# Patient Record
Sex: Male | Born: 1965 | Race: White | Hispanic: No | Marital: Married | Smoking: Never smoker
Health system: Southern US, Community
[De-identification: ages and names within clinical notes are randomized; demographics above are authoritative.]

## PROBLEM LIST (undated history)

## (undated) DIAGNOSIS — M1990 Unspecified osteoarthritis, unspecified site: Secondary | ICD-10-CM

---

## 2009-09-16 ENCOUNTER — Emergency Department (HOSPITAL_BASED_OUTPATIENT_CLINIC_OR_DEPARTMENT_OTHER): Admission: EM | Admit: 2009-09-16 | Discharge: 2009-09-16 | Payer: Self-pay | Admitting: Emergency Medicine

## 2009-09-16 ENCOUNTER — Ambulatory Visit: Payer: Self-pay | Admitting: Diagnostic Radiology

## 2010-05-18 LAB — BASIC METABOLIC PANEL
Calcium: 9.9 mg/dL (ref 8.4–10.5)
Chloride: 106 mEq/L (ref 96–112)
Glucose, Bld: 86 mg/dL (ref 70–99)

## 2010-05-18 LAB — DIFFERENTIAL
Basophils Absolute: 0.2 10*3/uL — ABNORMAL HIGH (ref 0.0–0.1)
Basophils Relative: 4 % — ABNORMAL HIGH (ref 0–1)
Eosinophils Absolute: 0 10*3/uL (ref 0.0–0.7)
Lymphs Abs: 2 10*3/uL (ref 0.7–4.0)
Monocytes Absolute: 0.3 10*3/uL (ref 0.1–1.0)
Monocytes Relative: 5 % (ref 3–12)
Neutro Abs: 3.4 10*3/uL (ref 1.7–7.7)
Neutrophils Relative %: 56 % (ref 43–77)

## 2010-05-18 LAB — CBC
HCT: 44.2 % (ref 39.0–52.0)
MCH: 32.9 pg (ref 26.0–34.0)
MCHC: 33.8 g/dL (ref 30.0–36.0)
MCV: 97.4 fL (ref 78.0–100.0)
Platelets: 317 10*3/uL (ref 150–400)

## 2010-05-18 LAB — POCT CARDIAC MARKERS
CKMB, poc: <1 ng/mL — ABNORMAL LOW (ref 1.0–8.0)
Troponin i, poc: <0.05 ng/mL (ref 0.00–0.09)
Troponin i, poc: <0.05 ng/mL (ref 0.00–0.09)

## 2012-04-16 ENCOUNTER — Encounter (HOSPITAL_BASED_OUTPATIENT_CLINIC_OR_DEPARTMENT_OTHER): Payer: Self-pay | Admitting: *Deleted

## 2012-04-16 ENCOUNTER — Emergency Department (HOSPITAL_BASED_OUTPATIENT_CLINIC_OR_DEPARTMENT_OTHER): Payer: Self-pay

## 2012-04-16 ENCOUNTER — Emergency Department (HOSPITAL_BASED_OUTPATIENT_CLINIC_OR_DEPARTMENT_OTHER)
Admission: EM | Admit: 2012-04-16 | Discharge: 2012-04-16 | Disposition: A | Discharge status: Home / Self Care | Payer: Self-pay | Attending: Emergency Medicine | Admitting: Emergency Medicine

## 2012-04-16 DIAGNOSIS — Y9389 Activity, other specified: Secondary | ICD-10-CM | POA: Insufficient documentation

## 2012-04-16 DIAGNOSIS — W010XXA Fall on same level from slipping, tripping and stumbling without subsequent striking against object, initial encounter: Secondary | ICD-10-CM | POA: Insufficient documentation

## 2012-04-16 DIAGNOSIS — Y9289 Other specified places as the place of occurrence of the external cause: Secondary | ICD-10-CM | POA: Insufficient documentation

## 2012-04-16 DIAGNOSIS — Y990 Civilian activity done for income or pay: Secondary | ICD-10-CM | POA: Insufficient documentation

## 2012-04-16 DIAGNOSIS — IMO0002 Reserved for concepts with insufficient information to code with codable children: Secondary | ICD-10-CM | POA: Insufficient documentation

## 2012-04-16 DIAGNOSIS — S46212A Strain of muscle, fascia and tendon of other parts of biceps, left arm, initial encounter: Secondary | ICD-10-CM

## 2012-04-16 MED ORDER — KETOROLAC TROMETHAMINE 60 MG/2ML IM SOLN
60.0000 mg | Freq: Once | INTRAMUSCULAR | Status: AC
  Administered 2012-04-16: 60 mg via INTRAMUSCULAR
  Filled 2012-04-16: qty 2

## 2012-04-16 NOTE — ED Notes (Signed)
Pt returned from xray

## 2012-04-16 NOTE — ED Notes (Signed)
Patient transported to X-ray 

## 2012-04-16 NOTE — ED Notes (Signed)
Pt sts he slipped on the ice and fell at work injuring his left upper arm.

## 2012-04-16 NOTE — ED Provider Notes (Addendum)
History     CSN: [id number]  Arrival date & time 05-11-2012  0312   First MD Initiated Contact with Patient 11-May-2012 0321      Chief Complaint  Patient presents with  . Arm Injury    (Consider location/radiation/quality/duration/timing/severity/associated sxs/prior treatment) HPI This is a 47 year old male who slipped on the ice and fell just prior to arrival. When he fell he fell onto his outstretched left hand. He is having moderate to severe pain in his left elbow and left biceps. There is no motor or sensory deficit associated with this. The pain is worse with palpation of the left biceps or extension of the left elbow; it is improved with flexion of the left elbow. He denies other injury. He has not taking any medication for this.  History reviewed. No pertinent past medical history.  History reviewed. No pertinent past surgical history.  No family history on file.  History  Substance Use Topics  . Smoking status: Never Smoker   . Smokeless tobacco: Not on file  . Alcohol Use: No      Review of Systems  All other systems reviewed and are negative.    Allergies  Review of patient's allergies indicates no known allergies.  Home Medications  No current outpatient prescriptions on file.  Pulse 110  Temp(Src) 98.1 F (36.7 C) (Oral)  Resp 18  Ht 5\' 9"  (1.753 m)  Wt 185 lb (83.915 kg)  BMI 27.31 kg/m2  SpO2 99%  Physical Exam General: Well-developed, well-nourished male in no acute distress; appearance consistent with age of record HENT: normocephalic, atraumatic Eyes: pupils equal round and reactive to light; extraocular muscles intact Neck: supple; nontender Heart: regular rate and rhythm Lungs: Normal respiratory effort and excursion Abdomen: soft; nondistended Extremities: No deformity; tender, contracted, tender left biceps with significant tenderness of the biceps tendon in the left antecubital fossa; decreased range of motion of left elbow due to pain  good flexion and extension of left elbow intact; left upper extremity distally neurovascularly intact with pulses +2 and brisk capillary refill Neurologic: Awake, alert and oriented; motor function intact in all extremities and symmetric; no facial droop Skin: Warm and dry Psychiatric: Normal mood and affect    ED Course  Procedures (including critical care time)     MDM  Nursing notes and vitals signs, including pulse oximetry, reviewed.  Summary of this visit's results, reviewed by myself:  Imaging Studies: Dg Elbow Complete Left  05/11/12  *RADIOLOGY REPORT*  Clinical Data: Status post fall; left elbow pain.  LEFT ELBOW - COMPLETE 3+ VIEW  Comparison: None.  Findings: There is no evidence of fracture or dislocation.  The visualized joint spaces are preserved.  No significant joint effusion is identified.  The soft tissues are unremarkable in appearance.  IMPRESSION: No evidence of fracture or dislocation.   Original Report Authenticated By: Tonia Ghent, M.D.    4:17 AM Exam consistent with partial rupture of the biceps tendon near its insertion in the antecubital fossa. We'll splint in flexion and refer to orthopedics.  4:44 AM Left upper extremity distally neurovascularly intact post splinting with no evidence of compartment syndrome at this time. Patient advised of signs and symptoms of compartment syndrome and need to return should these occur.        Hanley Seamen, MD 2012-05-11 [id number]  Hanley Seamen, MD 05-11-2012 [phone]

## 2013-06-29 ENCOUNTER — Emergency Department (HOSPITAL_COMMUNITY)
Admission: EM | Admit: 2013-06-29 | Discharge: 2013-06-29 | Discharge status: Against Medical Advice | Payer: No Typology Code available for payment source | Attending: Emergency Medicine | Admitting: Emergency Medicine

## 2013-06-29 ENCOUNTER — Encounter (HOSPITAL_COMMUNITY): Payer: Self-pay | Admitting: Emergency Medicine

## 2013-06-29 DIAGNOSIS — R5383 Other fatigue: Secondary | ICD-10-CM

## 2013-06-29 DIAGNOSIS — R5381 Other malaise: Secondary | ICD-10-CM | POA: Insufficient documentation

## 2013-06-29 DIAGNOSIS — R51 Headache: Secondary | ICD-10-CM | POA: Insufficient documentation

## 2013-06-29 DIAGNOSIS — M2550 Pain in unspecified joint: Secondary | ICD-10-CM | POA: Insufficient documentation

## 2013-06-29 DIAGNOSIS — R509 Fever, unspecified: Secondary | ICD-10-CM | POA: Insufficient documentation

## 2013-06-29 HISTORY — DX: Unspecified osteoarthritis, unspecified site: M19.90

## 2013-06-29 NOTE — ED Notes (Signed)
Pt left prior to evaluation.  States that he is feeling some better.

## 2013-06-29 NOTE — ED Notes (Signed)
Pt seen and treated previously [facility] Dr [facility].  Pt has since been experiencing fever, headaches, joint pain and weakness.  Pt was sent to department for evaluation and possible admission if needed.

## 2013-09-28 IMAGING — CR DG ELBOW COMPLETE 3+V*L*
4 series · 4 of 4 positions shown · non-contrast
Comparison: None.

CLINICAL DATA: Status post fall; left elbow pain.

LEFT ELBOW - COMPLETE 3+ VIEW

[x elbow joint lat left]
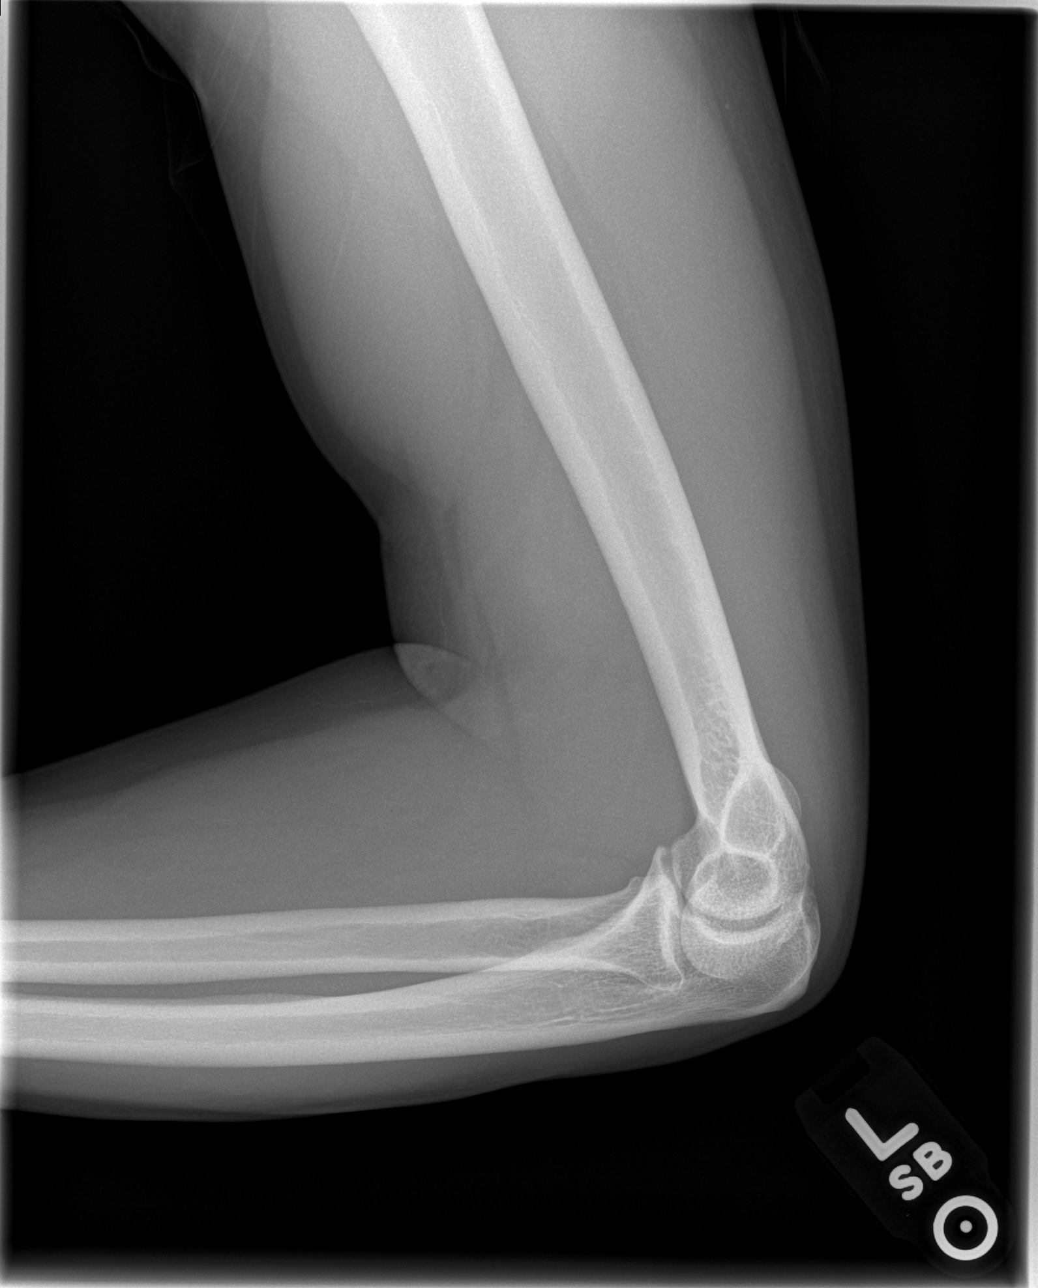

[x elbow joint ap left]
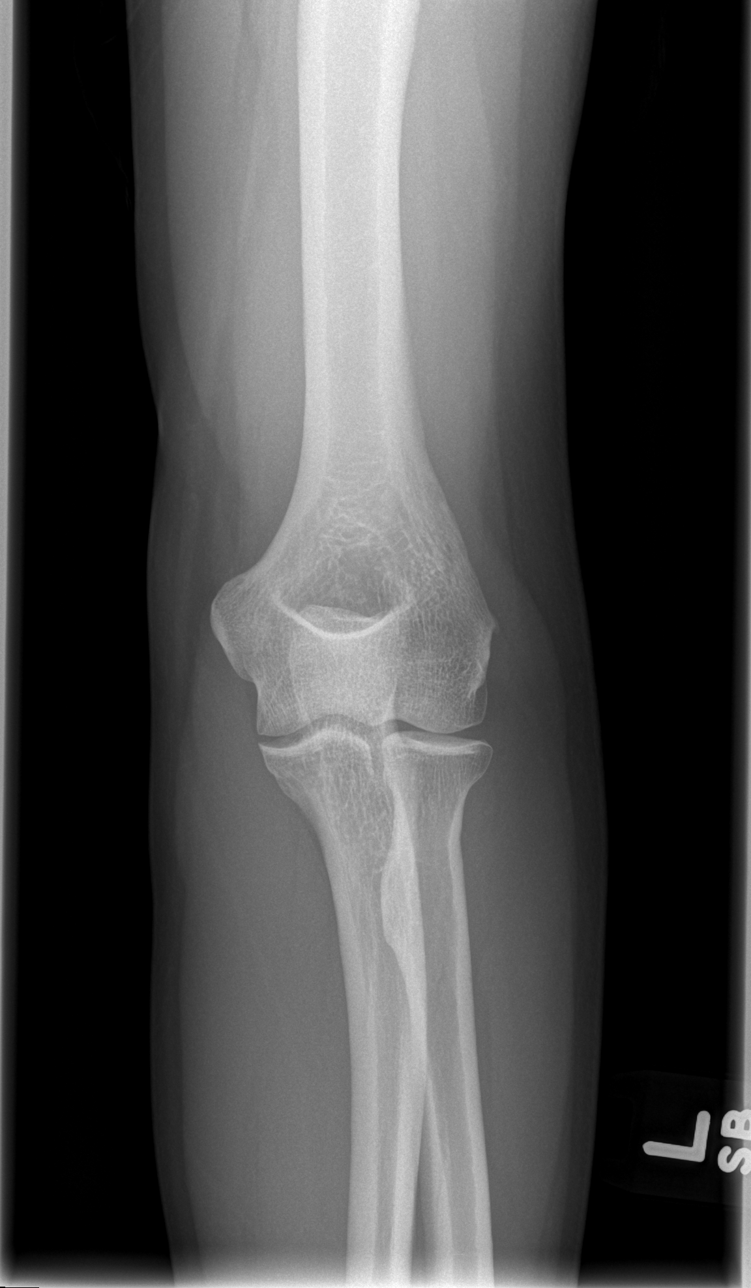

[x elbow joint obl. left (1 of 2)]
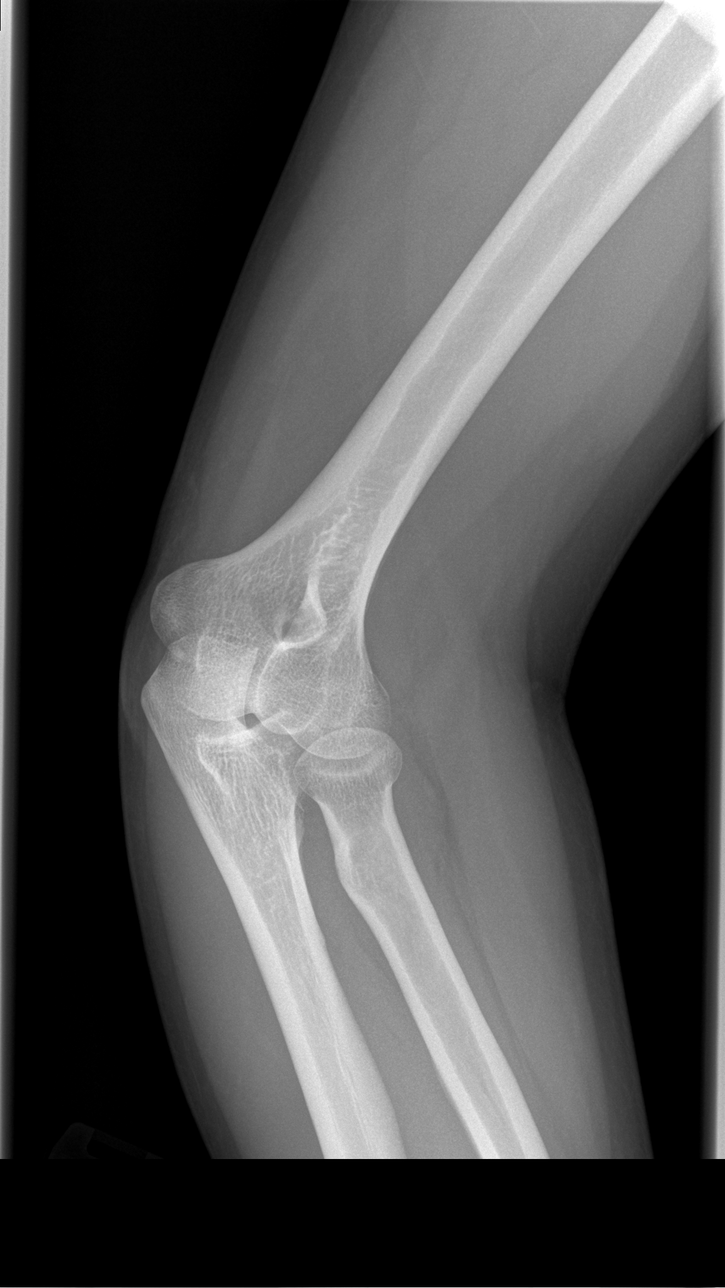

[x elbow joint obl. left (2 of 2)]
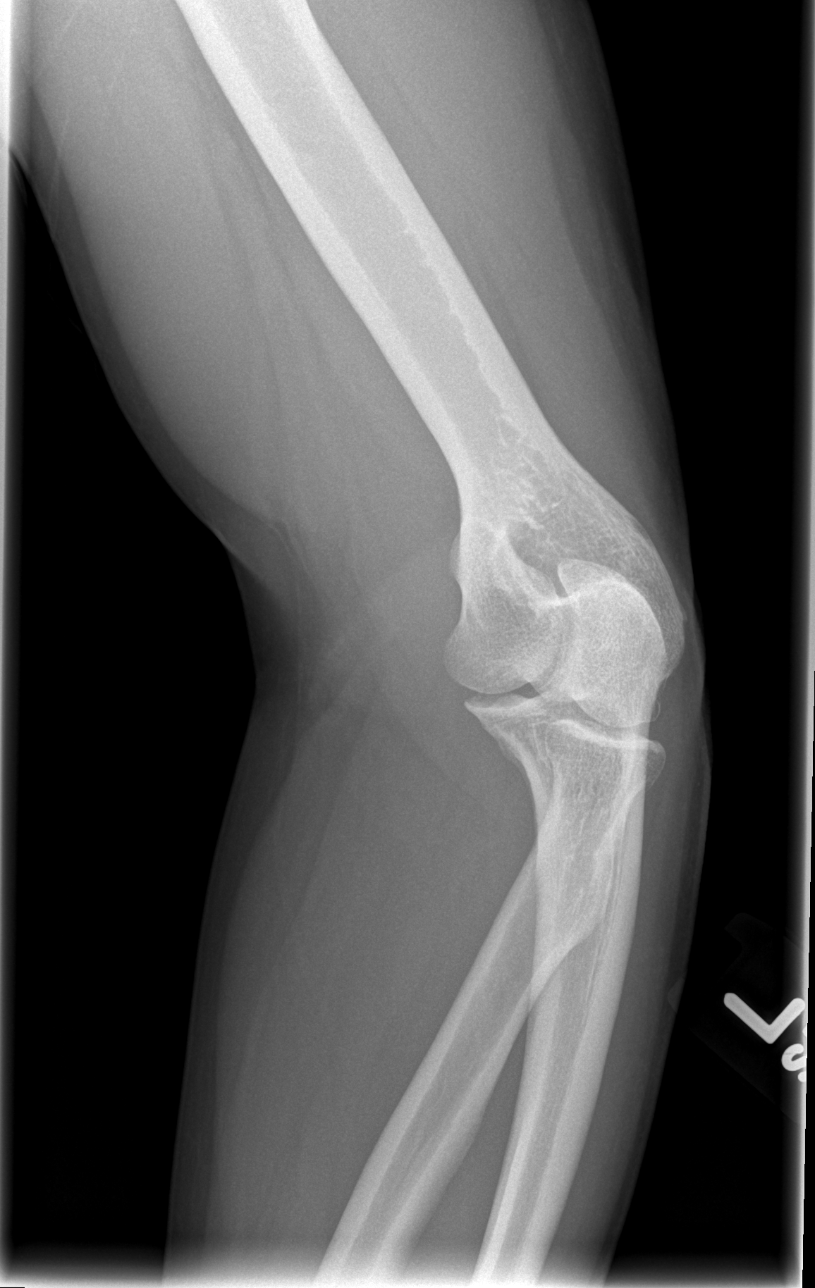

[4 of 4 positions shown; findings below may reference images not displayed]

FINDINGS: There is no evidence of fracture or dislocation.  The
visualized joint spaces are preserved.  No significant joint
effusion is identified.  The soft tissues are unremarkable in
appearance.
IMPRESSION: No evidence of fracture or dislocation.
